# Patient Record
Sex: Male | Born: 1995 | Race: White | Hispanic: No | Marital: Married | Smoking: Never smoker
Health system: Southern US, Community
[De-identification: ages and names within clinical notes are randomized; demographics above are authoritative.]

## PROBLEM LIST (undated history)

## (undated) DIAGNOSIS — F419 Anxiety disorder, unspecified: Secondary | ICD-10-CM

## (undated) DIAGNOSIS — F909 Attention-deficit hyperactivity disorder, unspecified type: Secondary | ICD-10-CM

## (undated) DIAGNOSIS — I10 Essential (primary) hypertension: Secondary | ICD-10-CM

## (undated) DIAGNOSIS — F32A Depression, unspecified: Secondary | ICD-10-CM

## (undated) HISTORY — PX: WISDOM TOOTH EXTRACTION: SHX21

---
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
## 2019-10-16 ENCOUNTER — Ambulatory Visit: Payer: Self-pay

## 2020-03-31 DIAGNOSIS — F5105 Insomnia due to other mental disorder: Secondary | ICD-10-CM | POA: Insufficient documentation

## 2020-08-24 DIAGNOSIS — I10 Essential (primary) hypertension: Secondary | ICD-10-CM | POA: Insufficient documentation

## 2021-04-11 ENCOUNTER — Ambulatory Visit (INDEPENDENT_AMBULATORY_CARE_PROVIDER_SITE_OTHER): Payer: BC Managed Care – PPO | Admitting: Internal Medicine

## 2021-04-11 ENCOUNTER — Other Ambulatory Visit: Payer: Self-pay

## 2021-04-11 VITALS — BP 144/92 | HR 94 | Resp 18 | Ht 74.0 in | Wt 341.0 lb

## 2021-04-11 DIAGNOSIS — F418 Other specified anxiety disorders: Secondary | ICD-10-CM | POA: Diagnosis not present

## 2021-04-11 DIAGNOSIS — F5105 Insomnia due to other mental disorder: Secondary | ICD-10-CM

## 2021-04-11 DIAGNOSIS — Z6841 Body Mass Index (BMI) 40.0 and over, adult: Secondary | ICD-10-CM | POA: Insufficient documentation

## 2021-04-11 DIAGNOSIS — G4733 Obstructive sleep apnea (adult) (pediatric): Secondary | ICD-10-CM

## 2021-04-11 NOTE — Progress Notes (Signed)
Sleep Medicine   Office Visit  Patient Name: Alejandro Frost DOB: [birth year] MRN [id number]    Chief Complaint: initial evalution/difficulty sleeping  Brief History:  Alejandro Frost is here today for initial consult for sleep evaluation. This patient has a  history of 1 year of these symptoms.    Sleep quality is fair. This is noted most nights. Patient reports he has trouble going to sleep and staying asleep. The patient relates the following symptoms: snoring in the past. Patient reports since he has lost 100lbs he doesn't snore anymore. The patient goes to sleep at 10-11pm and wakes up at 7am. Patient states he wakes up 4-5 times during the night. Patient reports he has trouble focusing and concentrating. Patient reports excessive sleepiness and fatigue.   Patient has noted no restlessness of his legs at night.  The patient  relates no unusual behavior during the night.  The patient reports a history of psychiatric problems (depression, anxiety)  The Epworth Sleepiness Score is 3 out of 24 .  The patient relates  Cardiovascular risk factors include: hypertension     ROS  General: (-) fever, (-) chills, (-) night sweat Nose and Sinuses: (-) nasal stuffiness or itchiness, (-) postnasal drip, (-) nosebleeds, (-) sinus trouble. Mouth and Throat: (-) sore throat, (-) hoarseness. Neck: (-) swollen glands, (-) enlarged thyroid, (-) neck pain. Respiratory: - cough, - shortness of breath, - wheezing. Neurologic: - numbness, - tingling. Psychiatric: + anxiety, + depression Sleep behavior: -sleep paralysis -hypnogogic hallucinations -dream enactment      -vivid dreams -cataplexy -night terrors -sleep walking   Current Medication: Outpatient Encounter Medications as of 04/11/2021  Medication Sig   lisinopril (ZESTRIL) 10 MG tablet Take by mouth.   lithium carbonate 300 MG capsule Take 300 mg by mouth 3 (three) times daily.   No facility-administered encounter medications on file as of 04/11/2021.     Surgical History: Past Surgical History:  Procedure Laterality Date   WISDOM TOOTH EXTRACTION      Medical History: No past medical history on file.  Family History: Non contributory to the present illness  Social History: Social History   Socioeconomic History   Marital status: Single    Spouse name: Not on file   Number of children: Not on file   Years of education: Not on file   Highest education level: Not on file  Occupational History   Not on file  Tobacco Use   Smoking status: Never   Smokeless tobacco: Never  Substance and Sexual Activity   Alcohol use: Not on file   Drug use: Not on file   Sexual activity: Not on file  Other Topics Concern   Not on file  Social History Narrative   Not on file   Social Determinants of Health   Financial Resource Strain: Not on file  Food Insecurity: Not on file  Transportation Needs: Not on file  Physical Activity: Not on file  Stress: Not on file  Social Connections: Not on file  Intimate Partner Violence: Not on file    Vital Signs: Blood pressure (!) 144/92, pulse 94, resp. rate 18, height 6\' 2"  (1.88 m), weight (!) 341 lb (154.7 kg), SpO2 97 %.  Examination: General Appearance: The patient is well-developed, well-nourished, and in no distress. Neck Circumference: 44.5 Skin: Gross inspection of skin unremarkable. Head: normocephalic, no gross deformities. Eyes: no gross deformities noted. ENT: ears appear grossly normal Neurologic: Alert and oriented. No involuntary movements.    EPWORTH SLEEPINESS SCALE:  Scale:  (0)= no chance of dozing; (1)= slight chance of dozing; (2)= moderate chance of dozing; (3)= high chance of dozing  Chance  Situtation    Sitting and reading: 1    Watching TV: 0    Sitting Inactive in public: 0    As a passenger in car: 1      Lying down to rest: 1    Sitting and talking: 0    Sitting quielty after lunch: 0    In a car, stopped in traffic: 0   TOTAL SCORE:    3 out of 24    SLEEP STUDIES:  none   LABS: No results found for this or any previous visit (from the past 2160 hour(s)).  Radiology: No results found.  No results found.  No results found.    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 Insomnia secondary to depression with anxiety Pt admits to rumination. His depression and anxiety are not well controlled. I went over sleep hygiene with him at length and he will discuss with his psychiatrist. I do think a PSG will be beneficial. He may need cbti. I also reviewed active thinking strategies with him.   2. Depression with anxiety Ongoing, seeing psychiatry. On lithium.   3. BMI 40.0-44.9, adult (HCC) Obesity Counseling: Had a lengthy discussion regarding patients BMI and weight issues. Patient was instructed on portion control as well as increased activity. Also discussed caloric restrictions with trying to maintain intake less than 2000 Kcal. Discussions were made in accordance with the 5As of weight management. Simple actions such as not eating late and if able to, taking a walk is suggested.    4. Obstructive sleep apnea:   Patient evaluation suggests high risk of sleep disordered breathing due to morbid obesity, snoring and daytime somnolence.  Patient has comorbid cardiovascular risk factors including: hypertension which could be exacerbated by pathologic sleep-disordered breathing.  Suggest: PSG to assess/treat the patient's sleep disordered breathing. The patient was also counselled on weight loss to optimize sleep health.    General Counseling: I have discussed the findings of the evaluation and examination with Alejandro Frost.  I have also discussed any further diagnostic evaluation thatmay be needed or ordered today.  Alejandro Frost verbalizes understanding of the findings of todays visit. We also reviewed his medications today and discussed drug interactions and side effects including but not limited excessive drowsiness and altered mental states. We also discussed that there is always a risk not just to him but also people around him. he has been encouraged to call the office with any questions or concerns that should arise related to todays visit.  No orders of the defined types were placed in this encounter.       I have personally obtained a history, evaluated the patient, evaluated pertinent data, formulated the assessment and plan and placed orders.   This patient was seen today by Emmaline Kluver, PA-C in collaboration with Dr. Freda Munro.     Yevonne Pax, MD [facility] Diplomate ABMS Pulmonary and Critical Care Medicine Sleep medicine

## 2022-03-08 ENCOUNTER — Ambulatory Visit
Admission: EM | Admit: 2022-03-08 | Discharge: 2022-03-08 | Disposition: A | Discharge status: Home / Self Care | Payer: BC Managed Care – PPO | Attending: Orthopedic Surgery | Admitting: Orthopedic Surgery

## 2022-03-08 DIAGNOSIS — R109 Unspecified abdominal pain: Secondary | ICD-10-CM | POA: Insufficient documentation

## 2022-03-08 HISTORY — DX: Essential (primary) hypertension: I10

## 2022-03-08 HISTORY — DX: Depression, unspecified: F32.A

## 2022-03-08 LAB — CBC
HCT: 46.9 % (ref 39.0–52.0)
Hemoglobin: 16.2 g/dL (ref 13.0–17.0)
MCH: 30.6 pg (ref 26.0–34.0)
MCHC: 34.5 g/dL (ref 30.0–36.0)
MCV: 88.7 fL (ref 80.0–100.0)
Platelets: 256 10*3/uL (ref 150–400)
RBC: 5.29 MIL/uL (ref 4.22–5.81)
RDW: 12.6 % (ref 11.5–15.5)
WBC: 11.8 10*3/uL — ABNORMAL HIGH (ref 4.0–10.5)
nRBC: 0 % (ref 0.0–0.2)

## 2022-03-08 LAB — URINALYSIS, COMPLETE (UACMP) WITH MICROSCOPIC
Glucose, UA: NEGATIVE mg/dL
Ketones, ur: 40 mg/dL — AB
Leukocytes,Ua: NEGATIVE
Nitrite: NEGATIVE
Specific Gravity, Urine: 1.02 (ref 1.005–1.030)
pH: 5.5 (ref 5.0–8.0)

## 2022-03-08 LAB — BASIC METABOLIC PANEL
Anion gap: 9 (ref 5–15)
BUN: 21 mg/dL — ABNORMAL HIGH (ref 6–20)
CO2: 23 mmol/L (ref 22–32)
Calcium: 10 mg/dL (ref 8.9–10.3)
Chloride: 108 mmol/L (ref 98–111)
Creatinine, Ser: 1.09 mg/dL (ref 0.61–1.24)
GFR, Estimated: >60 mL/min (ref >60)
Glucose, Bld: 96 mg/dL (ref 70–99)
Potassium: 3.9 mmol/L (ref 3.5–5.1)
Sodium: 140 mmol/L (ref 135–145)

## 2022-03-08 MED ORDER — TAMSULOSIN HCL 0.4 MG PO CAPS
0.4000 mg | ORAL_CAPSULE | Freq: Every day | ORAL | 0 refills | Status: AC
Start: 2022-03-08 — End: ?

## 2022-03-08 MED ORDER — ONDANSETRON 4 MG PO TBDP
4.0000 mg | ORAL_TABLET | Freq: Three times a day (TID) | ORAL | 0 refills | Status: AC | PRN
Start: 2022-03-08 — End: ?

## 2022-03-08 MED ORDER — HYDROCODONE-ACETAMINOPHEN 5-325 MG PO TABS: 1.0000 | ORAL_TABLET | ORAL | 0 refills | Status: AC | PRN

## 2022-03-08 NOTE — ED Provider Notes (Signed)
[facility]    CSN: [id number] Arrival date & time: 03/08/22  1911      History   Chief Complaint Chief Complaint  Patient presents with   Back Pain    HPI Alejandro Frost is a 26 y.o. male.  With history of hypertension, depression, presents to the urgent care for evaluation of right flank pain has been intermittent today said several episodes lasting 30 minutes to 1 hour over the last 12 hours.  He describes sharp pain that radiates from his right flank into the right groin, he denies any blood in his urine.  No history of kidney stones.  Patient states the pain is present he is a little nauseous.  He has not any medications for pain today.  He denies any fevers, nausea, vomiting.  His pain is currently not present.  Patient states he has had a history of suicidal ideation in the past but is currently not suicidal and has no thoughts of harming himself or others at this time.  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 Authorizing Provider  HYDROcodone-acetaminophen (NORCO) 5-325 MG tablet Take 1 tablet by mouth every 4 (four) hours as needed for moderate pain. 03/08/22  Yes Evon Slack, PA-C  lithium carbonate 300 MG capsule Take 300 mg by mouth 3 (three) times daily. 02/13/21  Yes [provider]  ondansetron (ZOFRAN-ODT) 4 MG disintegrating tablet Take 1 tablet (4 mg total) by mouth every 8 (eight) hours as needed for nausea or vomiting. 03/08/22  Yes Evon Slack,  PA-C  tamsulosin (FLOMAX) 0.4 MG CAPS capsule Take 1 capsule (0.4 mg total) by mouth daily after supper. 03/08/22  Yes Evon Slack, PA-C  lisinopril (ZESTRIL) 10 MG tablet Take by mouth. 12/01/20 12/01/21  [provider]    Family History History reviewed. No pertinent family history.  Social History Social History   Tobacco Use   Smoking status: Never   Smokeless tobacco: Never  Vaping Use   Vaping Use: Never used  Substance Use Topics   Alcohol use: Never   Drug use: Never     Allergies   Patient has no known allergies.   Review of Systems Review of Systems   Physical Exam Triage Vital Signs ED Triage Vitals  Enc Vitals Group     BP 03/08/22 1925 (!) 143/103     Pulse Rate 03/08/22 1925 83     Resp 03/08/22 1925 18     Temp 03/08/22 1925 98.7 F (37.1 C)     Temp Source 03/08/22 1925 Oral     SpO2 03/08/22 1925 99 %     Weight --      Height --      Head Circumference --      Peak Flow --      Pain Score 03/08/22 1926 0     Pain Loc --      Pain Edu? --  Excl. in GC? --    No data found.  Updated Vital Signs BP (!) 143/103 (BP Location: Left Arm)   Pulse 83   Temp 98.7 F (37.1 C) (Oral)   Resp 18   SpO2 99%   Visual Acuity Right Eye Distance:   Left Eye Distance:   Bilateral Distance:    Right Eye Near:   Left Eye Near:    Bilateral Near:     Physical Exam Constitutional:      Appearance: He is well-developed.  HENT:     Head: Normocephalic and atraumatic.  Eyes:     Conjunctiva/sclera: Conjunctivae normal.  Cardiovascular:     Rate and Rhythm: Normal rate.  Pulmonary:     Effort: Pulmonary effort is normal. No respiratory distress.  Abdominal:     General: There is no distension.     Tenderness: There is no abdominal tenderness. There is no right CVA tenderness, left CVA tenderness or guarding.  Musculoskeletal:        General: Normal range of motion.     Cervical back: Normal range of motion.  Skin:    General:  Skin is warm.     Findings: No rash.  Neurological:     General: No focal deficit present.     Mental Status: He is alert and oriented to person, place, and time.  Psychiatric:        Behavior: Behavior normal.        Thought Content: Thought content normal.      UC Treatments / Results  Labs (all labs ordered are listed, but only abnormal results are displayed) Labs Reviewed  URINALYSIS, COMPLETE (UACMP) WITH MICROSCOPIC - Abnormal; Notable for the following components:      Result Value   APPearance HAZY (*)    Hgb urine dipstick MODERATE (*)    Bilirubin Urine SMALL (*)    Ketones, ur 40 (*)    Protein, ur TRACE (*)    Bacteria, UA RARE (*)    All other components within normal limits  CBC - Abnormal; Notable for the following components:   WBC 11.8 (*)    All other components within normal limits  BASIC METABOLIC PANEL - Abnormal; Notable for the following components:   BUN 21 (*)    All other components within normal limits    EKG   Radiology No results found.  Procedures Procedures (including critical care time)  Medications Ordered in UC Medications - No data to display  Initial Impression / Assessment and Plan / UC Course  I have reviewed the triage vital signs and the nursing notes.  Pertinent labs & imaging results that were available during my care of the patient were reviewed by me and considered in my medical decision making (see chart for details).     26 year old male with right flank pain intermittently over the last 12 hours.  He describes radiating pain in his right flank extending into his right groin.  No fevers, difficulty with urination.  Currently not having any pain discomfort nausea or vomiting.  He has not any medications for his pain.  He has no abdominal tenderness on exam.  Vital signs are stable, afebrile.  His urinalysis did show some hematuria with RBCs.  White count was only slightly elevated at 11.8.  His BMP within normal limits.   Urinalysis showed no sign of infection.  Patient is placed on Norco, Zofran ambulated some.  He is educated on signs symptoms return to the ED  for.  He will follow-up with urology. Final Clinical Impressions(s) / UC Diagnoses   Final diagnoses:  Right flank pain     Discharge Instructions      Please take medications as prescribed as needed.  If your pain worsens or if you develop a fever or have any urgent changes in your health you need to go to the emergency department immediately.  Call urology office to schedule follow-up appointment.  Use strainer when urinating to capture possible stone.  Make sure you are drinking lots of fluids.     ED Prescriptions     Medication Sig Dispense Auth. Provider   tamsulosin (FLOMAX) 0.4 MG CAPS capsule Take 1 capsule (0.4 mg total) by mouth daily after supper. 10 capsule Evon Slack, PA-C   HYDROcodone-acetaminophen (NORCO) 5-325 MG tablet Take 1 tablet by mouth every 4 (four) hours as needed for moderate pain. 15 tablet Amador Cunas C, PA-C   ondansetron (ZOFRAN-ODT) 4 MG disintegrating tablet Take 1 tablet (4 mg total) by mouth every 8 (eight) hours as needed for nausea or vomiting. 20 tablet Evon Slack, PA-C      I have reviewed the PDMP during this encounter.   Evon Slack, PA-C 03/08/22 2019

## 2022-03-08 NOTE — Discharge Instructions (Addendum)
Please take medications as prescribed as needed.  If your pain worsens or if you develop a fever or have any urgent changes in your health you need to go to the emergency department immediately.  Call urology office to schedule follow-up appointment.  Use strainer when urinating to capture possible stone.  Make sure you are drinking lots of fluids.

## 2022-03-08 NOTE — ED Triage Notes (Signed)
Pt states this earlier today he had right flank pain that was intermittent. Pt states that pain became constant and increased at 1800. Pt states the pain radiates into the lower abd and testicle. Pt has had nausea but no vomiting. Pt states he feels like he needs to void when pain is intense but unable to void at that time.

## 2023-09-23 ENCOUNTER — Ambulatory Visit
Admission: RE | Admit: 2023-09-23 | Discharge: 2023-09-23 | Disposition: A | Discharge status: Home / Self Care | Payer: Self-pay | Source: Ambulatory Visit | Attending: Emergency Medicine | Admitting: Emergency Medicine

## 2023-09-23 VITALS — BP 115/78 | HR 62 | Temp 98.2°F | Resp 16 | Ht 74.5 in | Wt 300.0 lb

## 2023-09-23 DIAGNOSIS — R55 Syncope and collapse: Secondary | ICD-10-CM

## 2023-09-23 DIAGNOSIS — R42 Dizziness and giddiness: Secondary | ICD-10-CM

## 2023-09-23 HISTORY — DX: Anxiety disorder, unspecified: F41.9

## 2023-09-23 HISTORY — DX: Depression, unspecified: F32.A

## 2023-09-23 HISTORY — DX: Attention-deficit hyperactivity disorder, unspecified type: F90.9
